# Patient Record
Sex: Male | Born: 1975 | Race: Black or African American | Hispanic: No | Marital: Married | Smoking: Never smoker
Health system: Southern US, Community
[De-identification: ages and names within clinical notes are randomized; demographics above are authoritative.]

## PROBLEM LIST (undated history)

## (undated) DIAGNOSIS — Z789 Other specified health status: Secondary | ICD-10-CM

## (undated) HISTORY — PX: OTHER SURGICAL HISTORY: SHX169

---

## 2016-11-08 ENCOUNTER — Emergency Department (HOSPITAL_COMMUNITY): Payer: BLUE CROSS/BLUE SHIELD

## 2016-11-08 ENCOUNTER — Encounter (HOSPITAL_COMMUNITY): Payer: Self-pay

## 2016-11-08 ENCOUNTER — Emergency Department (HOSPITAL_COMMUNITY)
Admission: EM | Admit: 2016-11-08 | Discharge: 2016-11-08 | Disposition: A | Discharge status: Home / Self Care | Payer: BLUE CROSS/BLUE SHIELD | Attending: Emergency Medicine | Admitting: Emergency Medicine

## 2016-11-08 DIAGNOSIS — M25511 Pain in right shoulder: Secondary | ICD-10-CM | POA: Insufficient documentation

## 2016-11-08 HISTORY — DX: Other specified health status: Z78.9

## 2016-11-08 MED ORDER — IBUPROFEN 800 MG PO TABS: 800.0000 mg | ORAL_TABLET | Freq: Three times a day (TID) | ORAL | 0 refills | Status: AC

## 2016-11-08 MED ORDER — KETOROLAC TROMETHAMINE 60 MG/2ML IM SOLN
60.0000 mg | Freq: Once | INTRAMUSCULAR | Status: AC
  Administered 2016-11-08: 60 mg via INTRAMUSCULAR
  Filled 2016-11-08: qty 2

## 2016-11-08 NOTE — ED Provider Notes (Signed)
[facility]-EMERGENCY DEPT Provider Note   CSN: [id number] Arrival date & time: 11/08/16  0431     History   Chief Complaint Chief Complaint  Patient presents with  . Shoulder Pain    HPI Patrick Friedman is a [age] y.o. male with no major medical problems presents to the Emergency Department complaining of gradual, persistent, progressively worsening right shoulder onset 4 days ago.  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  Pt reports he started taking ibuprofen yesterday which has helped significantly.  Movement, specifically lifting, makes it worse.  Pt denies fever, chills, headache, neck pain, chest pain, SOB, diaphoresis, neck pain, cough.     The history is provided by the patient and medical records. No language interpreter was used.    Past Medical History:  Diagnosis Date  . Known health problems: none     There are no active problems to display for this patient.   Past Surgical History:  Procedure Laterality Date  . none         Home Medications    Prior to Admission medications   Medication Sig Start Date End Date Taking? Authorizing Provider  ibuprofen (ADVIL,MOTRIN) 800 MG tablet Take 1 tablet (800 mg total) by mouth 3 (three) times daily. 11/08/16   Dahlia ClientHannah Saeed Toren, PA-C    Family History History reviewed. No pertinent family history.  Social History Social History  Substance Use Topics  . Smoking status: Never Smoker  . Smokeless tobacco: Never Used  . Alcohol use No     Allergies   Patient has no known allergies.   Review of Systems Review of Systems  Musculoskeletal: Positive for arthralgias ( right shoulder).  All other systems reviewed and are negative.    Physical Exam Updated Vital Signs BP 136/88   Pulse 62   Temp 98.2 F (36.8 C) (Oral)   Resp 14   Ht 5\' 9"  (1.753 m)   Wt 77.1 kg   SpO2 99%   BMI 25.10 kg/m   Physical Exam  Constitutional: He appears well-developed and well-nourished.  No distress.  HENT:  Head: Normocephalic and atraumatic.  Eyes: Conjunctivae are normal.  Neck: Normal range of motion.  Cardiovascular: Normal rate, regular rhythm and intact distal pulses.   Capillary refill < 3 sec  Pulmonary/Chest: Effort normal and breath sounds normal.  Musculoskeletal: He exhibits tenderness. He exhibits no edema.       Right shoulder: He exhibits decreased range of motion, tenderness and pain. He exhibits no swelling, no effusion, no crepitus, no deformity, no laceration and normal pulse.       Right elbow: He exhibits normal range of motion and no swelling.       Right wrist: He exhibits normal range of motion and no tenderness.       Right hand: He exhibits normal range of motion. Normal sensation noted. Normal strength noted.  Right shoulder with decreased range of motion and tenderness to palpation over the before meals joint. Pain is reproduced with abduction and and adduction of the shoulder.  Neurological: He is alert. Coordination normal.  Sensation is intact to the bilateral upper extremities Strength 5/5 with grip strength bilaterally.  Skin: Skin is warm and dry. He is not diaphoretic.  No tenting of the skin  Psychiatric: He has a normal mood and affect.  Nursing note and vitals reviewed.    ED Treatments / Results   Radiology Dg Shoulder Right  Result Date:  11/08/2016 CLINICAL DATA:  Right shoulder pain for several days, nontraumatic EXAM: RIGHT SHOULDER - 2+ VIEW COMPARISON:  None. FINDINGS: There is no evidence of fracture or dislocation. There is no evidence of arthropathy or other focal bone abnormality. Soft tissues are unremarkable. IMPRESSION: Negative. Electronically Signed   By: Ellery Plunkaniel R Mitchell M.D.   On: 11/08/2016 05:40    Procedures Procedures (including critical care time)  Medications Ordered in ED Medications  ketorolac (TORADOL) injection 60 mg (60 mg Intramuscular Given 11/08/16 0540)     Initial Impression /  Assessment and Plan / ED Course  I have reviewed the triage vital signs and the nursing notes.  Pertinent labs & imaging results that were available during my care of the patient were reviewed by me and considered in my medical decision making (see chart for details).  Clinical Course     Patient X-Ray negative for obvious fracture or dislocation. Pain managed in ED. Symptoms are consistent with an impingement syndrome. Pt advised to follow up with orthopedics for further evaluation. Patient is without chest pain, shortness of breath, nausea, dizziness to suggest atypical ACS. Patient is without risk factors for ACS. Patient given sling while in ED, conservative therapy recommended and discussed. Patient will be dc home & is agreeable with above plan.   Final Clinical Impressions(s) / ED Diagnoses   Final diagnoses:  Acute pain of right shoulder    New Prescriptions Discharge Medication List as of 11/08/2016  5:53 AM    START taking these medications   Details  ibuprofen (ADVIL,MOTRIN) 800 MG tablet Take 1 tablet (800 mg total) by mouth 3 (three) times daily., Starting Mon 11/08/2016, [employer]         Alexxa Sabet, PA-C 11/08/16 0720    Dione Boozeavid Glick, MD 11/08/16 [phone]

## 2016-11-08 NOTE — Discharge Instructions (Signed)
1. Medications: alternate ibuprofen and tylenol for pain control, usual home medications 2. Treatment: rest, ice, elevate and use brace, drink plenty of fluids, gentle stretching 3. Follow Up: Please followup with orthopedics as directed or your PCP in 1 week if no improvement for discussion of your diagnoses and further evaluation after today's visit; if you do not have a primary care doctor use the resource guide provided to find one; Please return to the ER for worsening symptoms or other concerns  

## 2016-11-08 NOTE — ED Triage Notes (Signed)
Right shoulder pain x 3 days worse today. Denies injury. Painful to raise right arm

## 2017-10-31 IMAGING — DX DG SHOULDER 2+V*R*
3 series · 3 of 3 positions shown · non-contrast
Comparison: None.

CLINICAL DATA: Right shoulder pain for several days, nontraumatic

EXAM:
RIGHT SHOULDER - 2+ VIEW

[shoulder grashey]
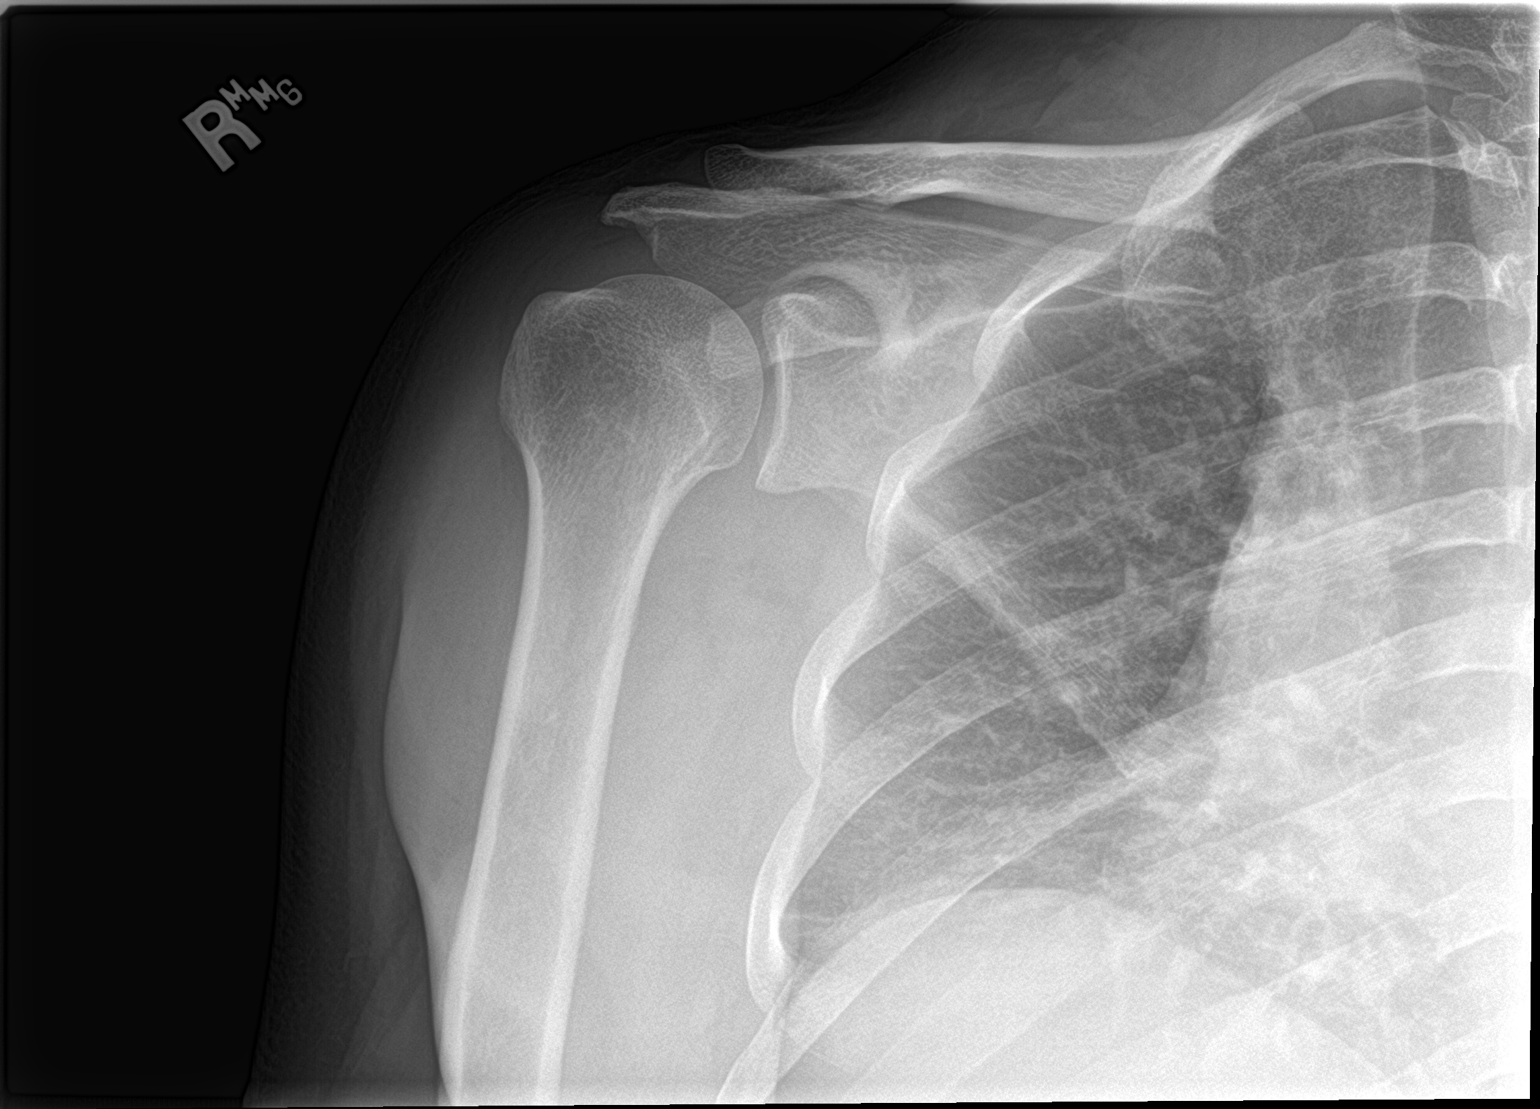

[shoulder y view]
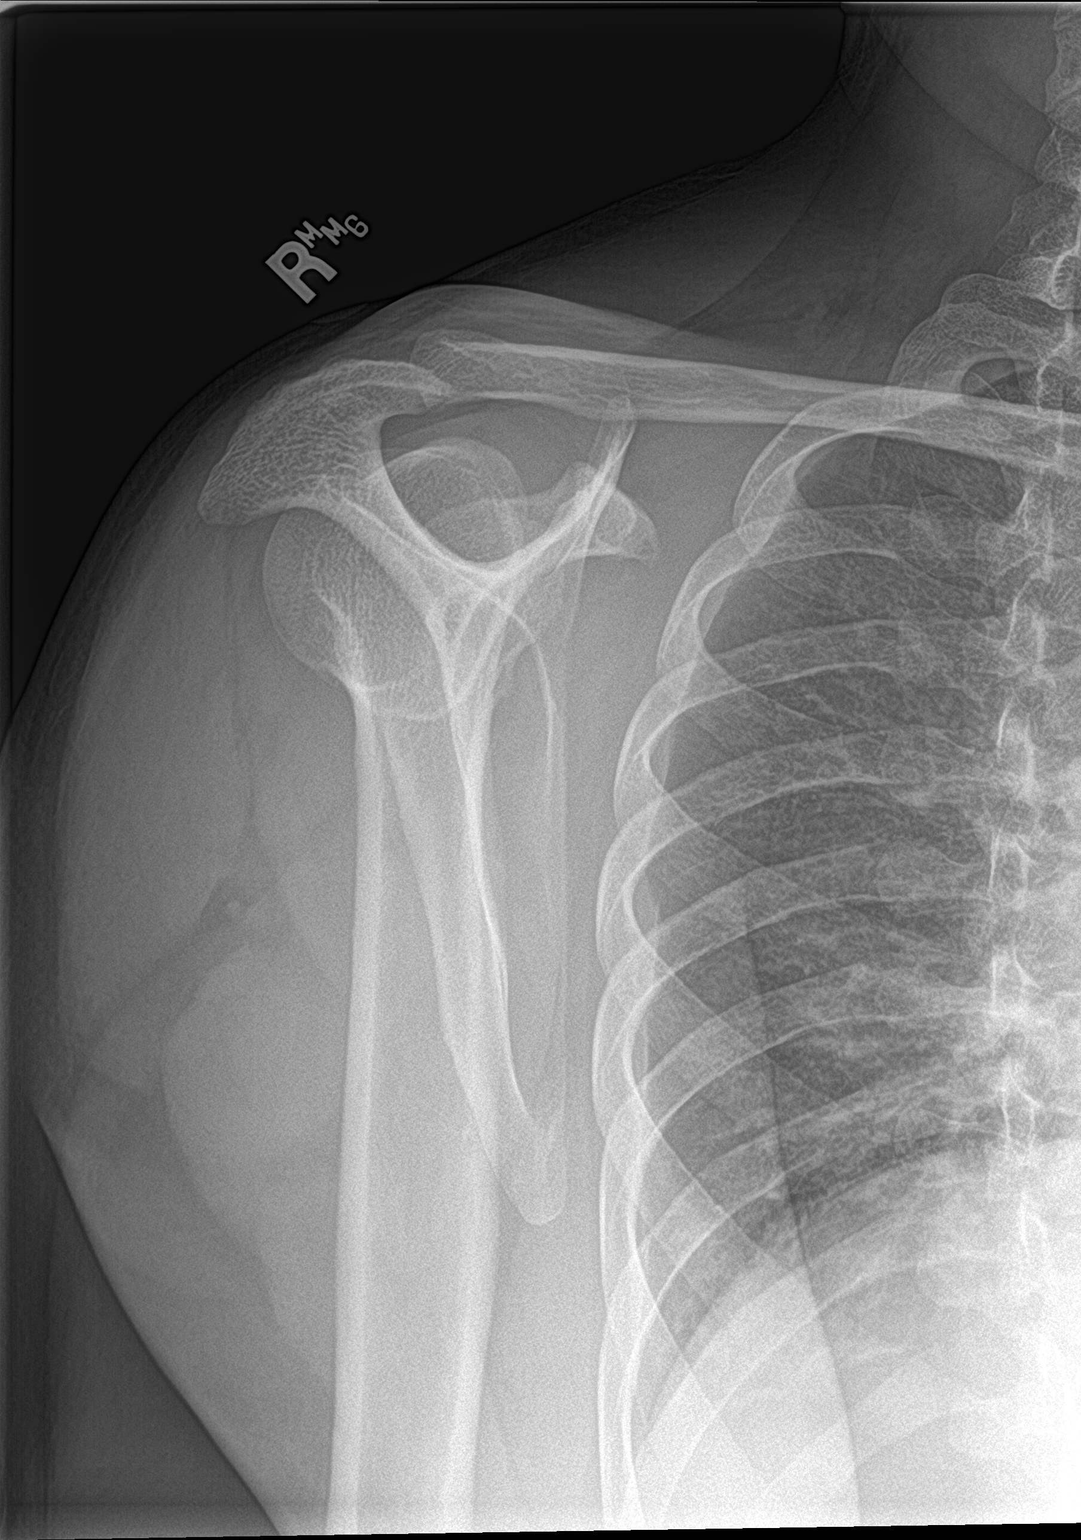

[shoulder axillary]
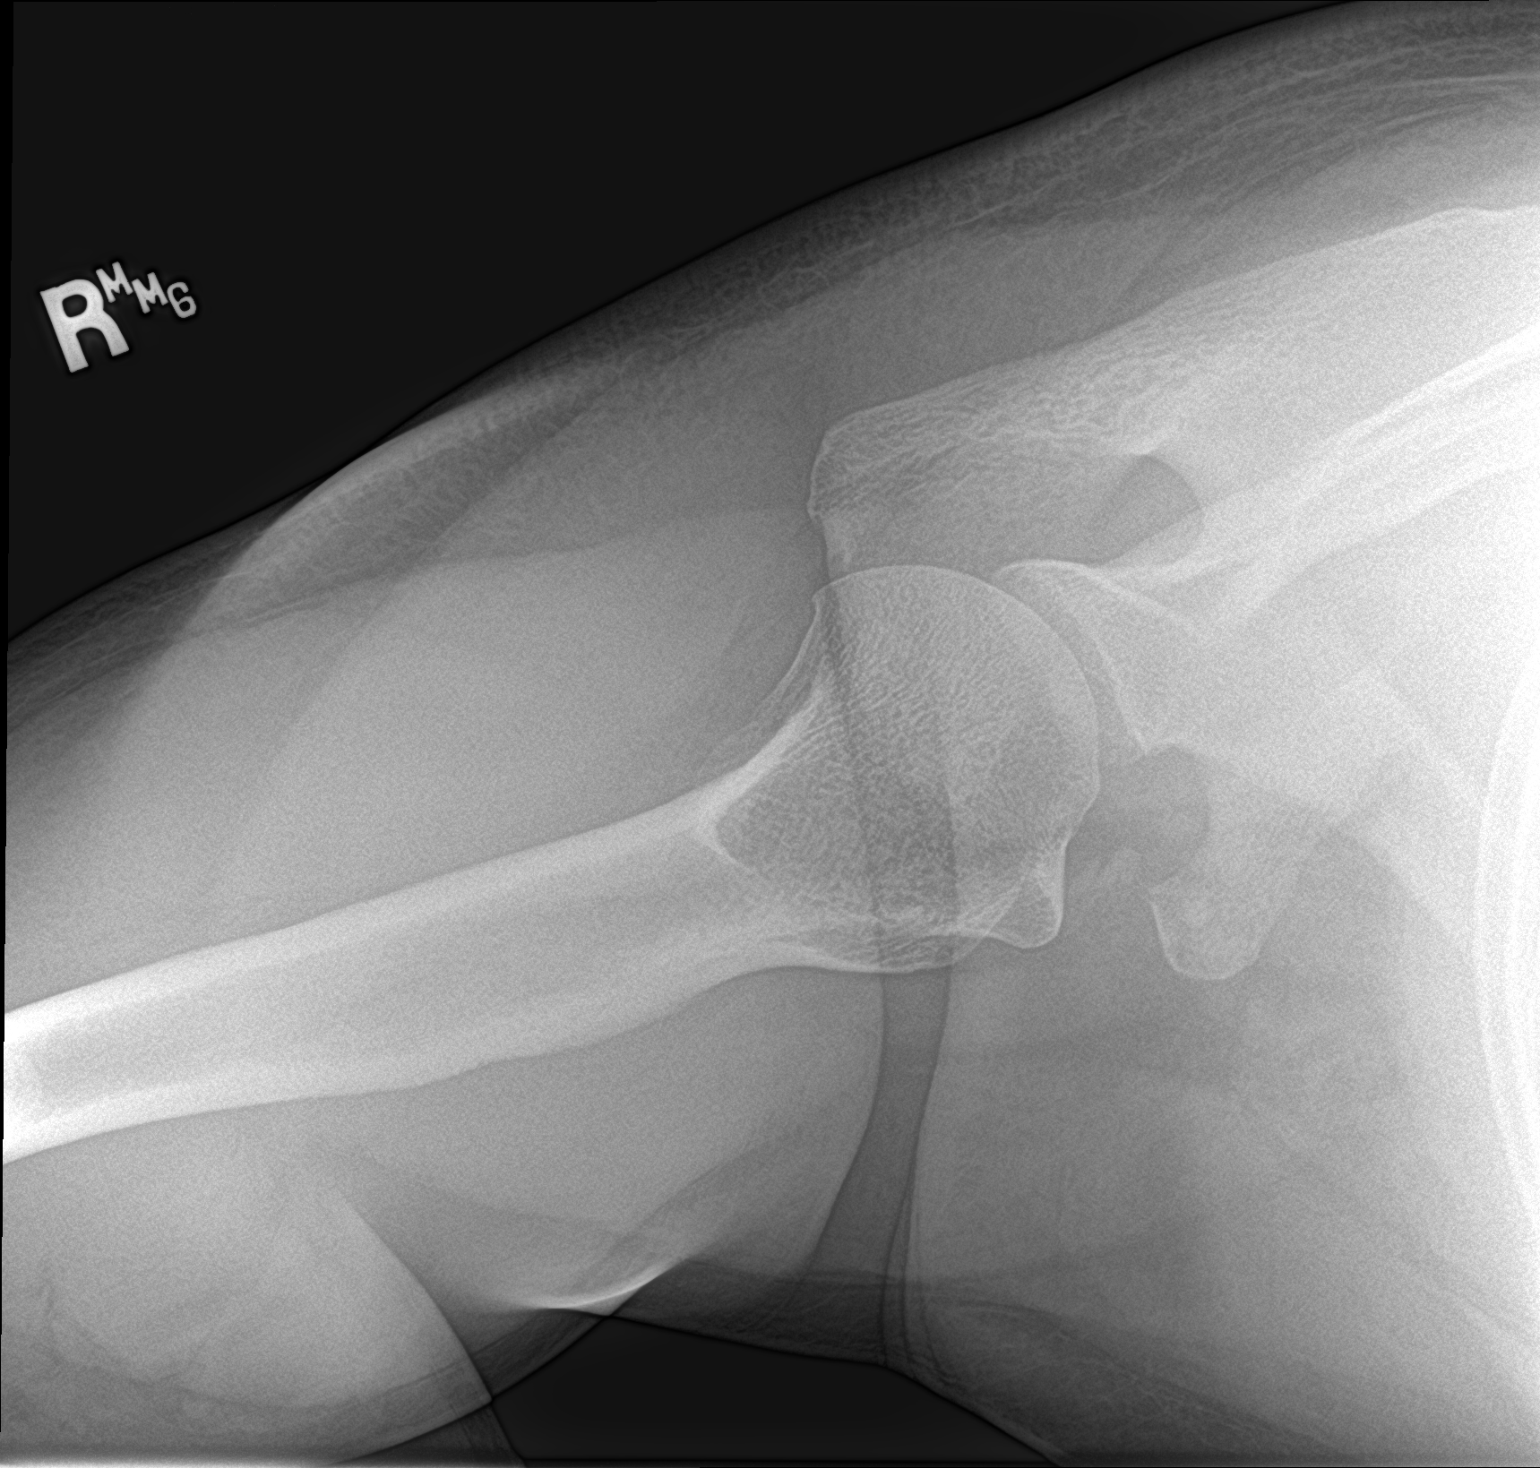

[3 of 3 positions shown; findings below may reference images not displayed]

FINDINGS: There is no evidence of fracture or dislocation. There is no
evidence of arthropathy or other focal bone abnormality. Soft
tissues are unremarkable.
IMPRESSION: Negative.
